# Patient Record
Sex: Male | Born: 1945 | Race: White | Hispanic: No | Marital: Married | Smoking: Never smoker
Health system: Southern US, Community
[De-identification: ages and names within clinical notes are randomized; demographics above are authoritative.]

## PROBLEM LIST (undated history)

## (undated) DIAGNOSIS — I10 Essential (primary) hypertension: Secondary | ICD-10-CM

## (undated) DIAGNOSIS — E785 Hyperlipidemia, unspecified: Secondary | ICD-10-CM

## (undated) DIAGNOSIS — F32A Depression, unspecified: Secondary | ICD-10-CM

## (undated) DIAGNOSIS — F329 Major depressive disorder, single episode, unspecified: Secondary | ICD-10-CM

---

## 2009-07-01 ENCOUNTER — Emergency Department (HOSPITAL_BASED_OUTPATIENT_CLINIC_OR_DEPARTMENT_OTHER): Admission: EM | Admit: 2009-07-01 | Discharge: 2009-07-01 | Payer: Self-pay | Admitting: Emergency Medicine

## 2011-01-21 ENCOUNTER — Emergency Department (HOSPITAL_BASED_OUTPATIENT_CLINIC_OR_DEPARTMENT_OTHER)
Admission: EM | Admit: 2011-01-21 | Discharge: 2011-01-21 | Disposition: A | Discharge status: Nursing Home (Includes ICF) | Payer: Managed Care, Other (non HMO) | Source: Home / Self Care | Attending: Emergency Medicine | Admitting: Emergency Medicine

## 2011-01-21 ENCOUNTER — Observation Stay (HOSPITAL_COMMUNITY)
Admission: EM | Admit: 2011-01-21 | Discharge: 2011-01-22 | Disposition: A | Discharge status: Home / Self Care | Payer: Managed Care, Other (non HMO) | Attending: Emergency Medicine | Admitting: Emergency Medicine

## 2011-01-21 ENCOUNTER — Emergency Department (HOSPITAL_COMMUNITY): Payer: Managed Care, Other (non HMO)

## 2011-01-21 ENCOUNTER — Emergency Department (INDEPENDENT_AMBULATORY_CARE_PROVIDER_SITE_OTHER): Payer: Managed Care, Other (non HMO)

## 2011-01-21 ENCOUNTER — Inpatient Hospital Stay (HOSPITAL_COMMUNITY): Admission: EM | Admit: 2011-01-21 | Payer: Self-pay | Source: Ambulatory Visit | Admitting: Orthopedic Surgery

## 2011-01-21 DIAGNOSIS — IMO0002 Reserved for concepts with insufficient information to code with codable children: Secondary | ICD-10-CM

## 2011-01-21 DIAGNOSIS — Z01812 Encounter for preprocedural laboratory examination: Secondary | ICD-10-CM | POA: Insufficient documentation

## 2011-01-21 DIAGNOSIS — I10 Essential (primary) hypertension: Secondary | ICD-10-CM | POA: Insufficient documentation

## 2011-01-21 DIAGNOSIS — Z1810 Retained metal fragments, unspecified: Secondary | ICD-10-CM

## 2011-01-21 DIAGNOSIS — S62339B Displaced fracture of neck of unspecified metacarpal bone, initial encounter for open fracture: Secondary | ICD-10-CM | POA: Insufficient documentation

## 2011-01-21 DIAGNOSIS — S61209A Unspecified open wound of unspecified finger without damage to nail, initial encounter: Secondary | ICD-10-CM | POA: Insufficient documentation

## 2011-01-21 DIAGNOSIS — Z79899 Other long term (current) drug therapy: Secondary | ICD-10-CM | POA: Insufficient documentation

## 2011-01-21 DIAGNOSIS — S61409A Unspecified open wound of unspecified hand, initial encounter: Secondary | ICD-10-CM | POA: Insufficient documentation

## 2011-01-21 DIAGNOSIS — S6290XB Unspecified fracture of unspecified wrist and hand, initial encounter for open fracture: Principal | ICD-10-CM | POA: Insufficient documentation

## 2011-01-21 DIAGNOSIS — W320XXA Accidental handgun discharge, initial encounter: Secondary | ICD-10-CM | POA: Insufficient documentation

## 2011-01-21 DIAGNOSIS — E785 Hyperlipidemia, unspecified: Secondary | ICD-10-CM | POA: Insufficient documentation

## 2011-01-21 LAB — CBC
HCT: 42.7 % (ref 39.0–52.0)
MCH: 31.4 pg (ref 26.0–34.0)
MCV: 95.7 fL (ref 78.0–100.0)
Platelets: 111 10*3/uL — ABNORMAL LOW (ref 150–400)
RBC: 4.46 MIL/uL (ref 4.22–5.81)
RDW: 13.5 % (ref 11.5–15.5)
WBC: 12.5 10*3/uL — ABNORMAL HIGH (ref 4.0–10.5)

## 2011-01-21 LAB — BASIC METABOLIC PANEL
BUN: 13 mg/dL (ref 6–23)
Chloride: 103 mEq/L (ref 96–112)
Creatinine, Ser: 1.14 mg/dL (ref 0.4–1.5)
Glucose, Bld: 141 mg/dL — ABNORMAL HIGH (ref 70–99)
Potassium: 3.9 mEq/L (ref 3.5–5.1)

## 2011-01-21 LAB — DIFFERENTIAL
Eosinophils Absolute: 0.1 10*3/uL (ref 0.0–0.7)
Eosinophils Relative: 1 % (ref 0–5)
Lymphocytes Relative: 15 % (ref 12–46)
Lymphs Abs: 1.9 10*3/uL (ref 0.7–4.0)
Monocytes Relative: 9 % (ref 3–12)
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
## 2014-10-10 ENCOUNTER — Emergency Department (HOSPITAL_BASED_OUTPATIENT_CLINIC_OR_DEPARTMENT_OTHER)
Admission: EM | Admit: 2014-10-10 | Discharge: 2014-10-11 | Disposition: A | Discharge status: Other Acute Inpatient Hospital | Payer: Medicare HMO | Attending: Emergency Medicine | Admitting: Emergency Medicine

## 2014-10-10 ENCOUNTER — Encounter (HOSPITAL_BASED_OUTPATIENT_CLINIC_OR_DEPARTMENT_OTHER): Payer: Self-pay | Admitting: *Deleted

## 2014-10-10 ENCOUNTER — Emergency Department (HOSPITAL_BASED_OUTPATIENT_CLINIC_OR_DEPARTMENT_OTHER): Payer: Medicare HMO

## 2014-10-10 DIAGNOSIS — I10 Essential (primary) hypertension: Secondary | ICD-10-CM | POA: Insufficient documentation

## 2014-10-10 DIAGNOSIS — Z79899 Other long term (current) drug therapy: Secondary | ICD-10-CM | POA: Diagnosis not present

## 2014-10-10 DIAGNOSIS — E785 Hyperlipidemia, unspecified: Secondary | ICD-10-CM | POA: Diagnosis not present

## 2014-10-10 DIAGNOSIS — Y929 Unspecified place or not applicable: Secondary | ICD-10-CM | POA: Diagnosis not present

## 2014-10-10 DIAGNOSIS — S42202A Unspecified fracture of upper end of left humerus, initial encounter for closed fracture: Secondary | ICD-10-CM

## 2014-10-10 DIAGNOSIS — S79912A Unspecified injury of left hip, initial encounter: Secondary | ICD-10-CM | POA: Diagnosis not present

## 2014-10-10 DIAGNOSIS — F329 Major depressive disorder, single episode, unspecified: Secondary | ICD-10-CM | POA: Insufficient documentation

## 2014-10-10 DIAGNOSIS — Y9389 Activity, other specified: Secondary | ICD-10-CM | POA: Insufficient documentation

## 2014-10-10 DIAGNOSIS — W1839XA Other fall on same level, initial encounter: Secondary | ICD-10-CM | POA: Diagnosis not present

## 2014-10-10 DIAGNOSIS — W19XXXA Unspecified fall, initial encounter: Secondary | ICD-10-CM

## 2014-10-10 DIAGNOSIS — S4990XA Unspecified injury of shoulder and upper arm, unspecified arm, initial encounter: Secondary | ICD-10-CM | POA: Diagnosis present

## 2014-10-10 DIAGNOSIS — S42212A Unspecified displaced fracture of surgical neck of left humerus, initial encounter for closed fracture: Secondary | ICD-10-CM | POA: Insufficient documentation

## 2014-10-10 DIAGNOSIS — Y998 Other external cause status: Secondary | ICD-10-CM | POA: Diagnosis not present

## 2014-10-10 DIAGNOSIS — F10929 Alcohol use, unspecified with intoxication, unspecified: Secondary | ICD-10-CM

## 2014-10-10 DIAGNOSIS — F10120 Alcohol abuse with intoxication, uncomplicated: Secondary | ICD-10-CM | POA: Insufficient documentation

## 2014-10-10 HISTORY — DX: Major depressive disorder, single episode, unspecified: F32.9

## 2014-10-10 HISTORY — DX: Morbid (severe) obesity due to excess calories: E66.01

## 2014-10-10 HISTORY — DX: Essential (primary) hypertension: I10

## 2014-10-10 HISTORY — DX: Hyperlipidemia, unspecified: E78.5

## 2014-10-10 HISTORY — DX: Depression, unspecified: F32.A

## 2014-10-10 LAB — COMPREHENSIVE METABOLIC PANEL
ALK PHOS: 70 U/L (ref 39–117)
ALT: 18 U/L (ref 0–53)
AST: 32 U/L (ref 0–37)
Albumin: 3.4 g/dL — ABNORMAL LOW (ref 3.5–5.2)
Anion gap: 13 (ref 5–15)
BUN: 14 mg/dL (ref 6–23)
CHLORIDE: 101 meq/L (ref 96–112)
CO2: 24 meq/L (ref 19–32)
Calcium: 9.8 mg/dL (ref 8.4–10.5)
Creatinine, Ser: 1.4 mg/dL — ABNORMAL HIGH (ref 0.50–1.35)
GFR, EST AFRICAN AMERICAN: 58 mL/min — AB (ref >90)
GFR, EST NON AFRICAN AMERICAN: 50 mL/min — AB (ref >90)
GLUCOSE: 97 mg/dL (ref 70–99)
POTASSIUM: 3.8 meq/L (ref 3.7–5.3)
SODIUM: 138 meq/L (ref 137–147)
Total Bilirubin: 1.5 mg/dL — ABNORMAL HIGH (ref 0.3–1.2)
Total Protein: 7.4 g/dL (ref 6.0–8.3)

## 2014-10-10 LAB — CBC WITH DIFFERENTIAL/PLATELET
Basophils Absolute: 0.1 10*3/uL (ref 0.0–0.1)
Basophils Relative: 1 % (ref 0–1)
Eosinophils Absolute: 0.4 10*3/uL (ref 0.0–0.7)
Eosinophils Relative: 3 % (ref 0–5)
HCT: 37 % — ABNORMAL LOW (ref 39.0–52.0)
Hemoglobin: 12.1 g/dL — ABNORMAL LOW (ref 13.0–17.0)
LYMPHS ABS: 2.6 10*3/uL (ref 0.7–4.0)
LYMPHS PCT: 24 % (ref 12–46)
MCH: 32.9 pg (ref 26.0–34.0)
MCHC: 32.7 g/dL (ref 30.0–36.0)
MCV: 100.5 fL — AB (ref 78.0–100.0)
Monocytes Absolute: 1.3 10*3/uL — ABNORMAL HIGH (ref 0.1–1.0)
Monocytes Relative: 11 % (ref 3–12)
NEUTROS ABS: 6.7 10*3/uL (ref 1.7–7.7)
NEUTROS PCT: 61 % (ref 43–77)
PLATELETS: 68 10*3/uL — AB (ref 150–400)
RBC: 3.68 MIL/uL — AB (ref 4.22–5.81)
RDW: 15.1 % (ref 11.5–15.5)
WBC: 11 10*3/uL — AB (ref 4.0–10.5)

## 2014-10-10 LAB — ETHANOL: Alcohol, Ethyl (B): 181 mg/dL — ABNORMAL HIGH (ref 0–11)

## 2014-10-10 LAB — PROTIME-INR
INR: 1.37 (ref 0.00–1.49)
Prothrombin Time: 16.9 seconds — ABNORMAL HIGH (ref 11.6–15.2)

## 2014-10-10 MED ORDER — OXYCODONE-ACETAMINOPHEN 7.5-325 MG PO TABS: 1.0000 | ORAL_TABLET | ORAL | Status: AC | PRN

## 2014-10-10 MED ORDER — SODIUM CHLORIDE 0.9 % IV BOLUS (SEPSIS)
1000.0000 mL | Freq: Once | INTRAVENOUS | Status: AC
  Administered 2014-10-10: 1000 mL via INTRAVENOUS

## 2014-10-10 MED ORDER — ONDANSETRON HCL 4 MG/2ML IJ SOLN
4.0000 mg | Freq: Once | INTRAMUSCULAR | Status: AC
  Administered 2014-10-10: 4 mg via INTRAVENOUS
  Filled 2014-10-10: qty 2

## 2014-10-10 MED ORDER — MORPHINE SULFATE 4 MG/ML IJ SOLN
4.0000 mg | Freq: Once | INTRAMUSCULAR | Status: AC
  Administered 2014-10-10: 4 mg via INTRAVENOUS
  Filled 2014-10-10: qty 1

## 2014-10-10 NOTE — Discharge Instructions (Signed)
Humerus Fracture, Treated with Immobilization °The humerus is the large bone in the upper arm. A broken (fractured) humerus is often treated by wearing a cast, splint, or sling (immobilization). This holds the broken pieces in place so they can heal.  °HOME CARE °· Put ice on the injured area. °¨ Put ice in a plastic bag. °¨ Place a towel between your skin and the bag. °¨ Leave the ice on for 15-20 minutes, 03-04 times a day. °· If you are given a cast: °¨ Do not scratch the skin under the cast. °¨ Check the skin around the cast every day. You may put lotion on any red or sore areas. °¨ Keep the cast dry and clean. °· If you are given a splint: °¨ Wear the splint as told. °¨ Keep the splint clean and dry. °¨ Loosen the elastic around the splint if your fingers become numb, cold, tingle, or turn blue. °· If you are given a sling: °¨ Wear the sling as told. °· Do not put pressure on any part of the cast or splint until it is fully hardened. °· The cast or splint must be protected with a plastic bag during bathing. Do not lower the cast or splint into water. °· Only take medicine as told by your doctor. °· Do exercises as told by your doctor. °· Follow up as told by your doctor. °GET HELP RIGHT AWAY IF:  °· Your skin or fingernails turn blue or gray. °· Your arm feels cold or numb. °· You have very bad pain in the injured arm. °· You are having problems with the medicines you were given. °MAKE SURE YOU:  °· Understand these instructions. °· Will watch your condition. °· Will get help right away if you are not doing well or get worse. °Document Released: 05/03/2008 Document Revised: 02/07/2012 Document Reviewed: 12/30/2010 °[employer]® Patient Information ©2015 [employer]. This information is not intended to replace advice given to you by your health care provider. Make sure you discuss any questions you have with your health care provider. ° °

## 2014-10-10 NOTE — ED Notes (Signed)
MD at bedside. 

## 2014-10-10 NOTE — ED Notes (Signed)
Patient transported to X-ray 

## 2014-10-10 NOTE — ED Notes (Addendum)
Pt c/o fall with left shoulder and left hip pain x 10 mins ago pt reports feeling faint bp 80/40

## 2014-10-10 NOTE — ED Notes (Signed)
MD at bedside discussing plan of care.  Pt will be admitted since his bp is not holding sufficiently to go home.

## 2014-10-10 NOTE — ED Notes (Signed)
Pt c/o increased pain.  MD notified.  Order received.
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
## 2014-10-10 NOTE — ED Notes (Signed)
MD at bedside discussing test results and dispo plan of care. 

## 2014-10-10 NOTE — ED Notes (Signed)
Preparing for pt DC.  BP noted to have dropped again.  MD notified.  Fluids started.  Plan of care changed.  Pt will not be DC'd to home.  Calling hospitalist for admission.

## 2014-10-11 ENCOUNTER — Encounter (HOSPITAL_BASED_OUTPATIENT_CLINIC_OR_DEPARTMENT_OTHER): Payer: Self-pay | Admitting: Emergency Medicine

## 2014-10-11 DIAGNOSIS — Y9389 Activity, other specified: Secondary | ICD-10-CM | POA: Diagnosis not present

## 2014-10-11 DIAGNOSIS — S79912A Unspecified injury of left hip, initial encounter: Secondary | ICD-10-CM | POA: Diagnosis not present

## 2014-10-11 DIAGNOSIS — Y929 Unspecified place or not applicable: Secondary | ICD-10-CM | POA: Diagnosis not present

## 2014-10-11 DIAGNOSIS — I10 Essential (primary) hypertension: Secondary | ICD-10-CM | POA: Diagnosis not present

## 2014-10-11 DIAGNOSIS — F10120 Alcohol abuse with intoxication, uncomplicated: Secondary | ICD-10-CM | POA: Diagnosis not present

## 2014-10-11 DIAGNOSIS — Y998 Other external cause status: Secondary | ICD-10-CM | POA: Diagnosis not present

## 2014-10-11 DIAGNOSIS — E785 Hyperlipidemia, unspecified: Secondary | ICD-10-CM | POA: Diagnosis not present

## 2014-10-11 DIAGNOSIS — F329 Major depressive disorder, single episode, unspecified: Secondary | ICD-10-CM | POA: Diagnosis not present

## 2014-10-11 DIAGNOSIS — Z79899 Other long term (current) drug therapy: Secondary | ICD-10-CM | POA: Diagnosis not present

## 2014-10-11 DIAGNOSIS — S4990XA Unspecified injury of shoulder and upper arm, unspecified arm, initial encounter: Secondary | ICD-10-CM | POA: Diagnosis present

## 2014-10-11 DIAGNOSIS — W1839XA Other fall on same level, initial encounter: Secondary | ICD-10-CM | POA: Diagnosis not present

## 2014-10-11 DIAGNOSIS — S42212A Unspecified displaced fracture of surgical neck of left humerus, initial encounter for closed fracture: Secondary | ICD-10-CM | POA: Diagnosis not present

## 2014-10-11 NOTE — ED Notes (Signed)
tct lisa, [facility] to determine [facility] time of bed assignment, Misty StanleyLisa stated they would go ahead and place patient in a bed and would call back with a bed assignment immediately, awaiting return call with bed assignment

## 2014-10-11 NOTE — ED Notes (Signed)
Sp[facility] with [facility] supervisor at [facility], MD Anatole Hounnou did contact her and request a bed, but at this time they have no beds available for patient, but they are working to get one. Misty StanleyLisa stated she would contact this facility when a bed becomes available

## 2016-06-23 IMAGING — CR DG HIP (WITH OR WITHOUT PELVIS) 2-3V*L*
3 series · 3 of 3 positions shown · non-contrast
Comparison: CT pelvis 04/09/2008 and 03/04/2009.

CLINICAL DATA: Status post fall. Left hip pain. Injury occurred
today. Initially catheter.

EXAM:
LEFT HIP - COMPLETE 2+ VIEW

[t pelvis a.p.]
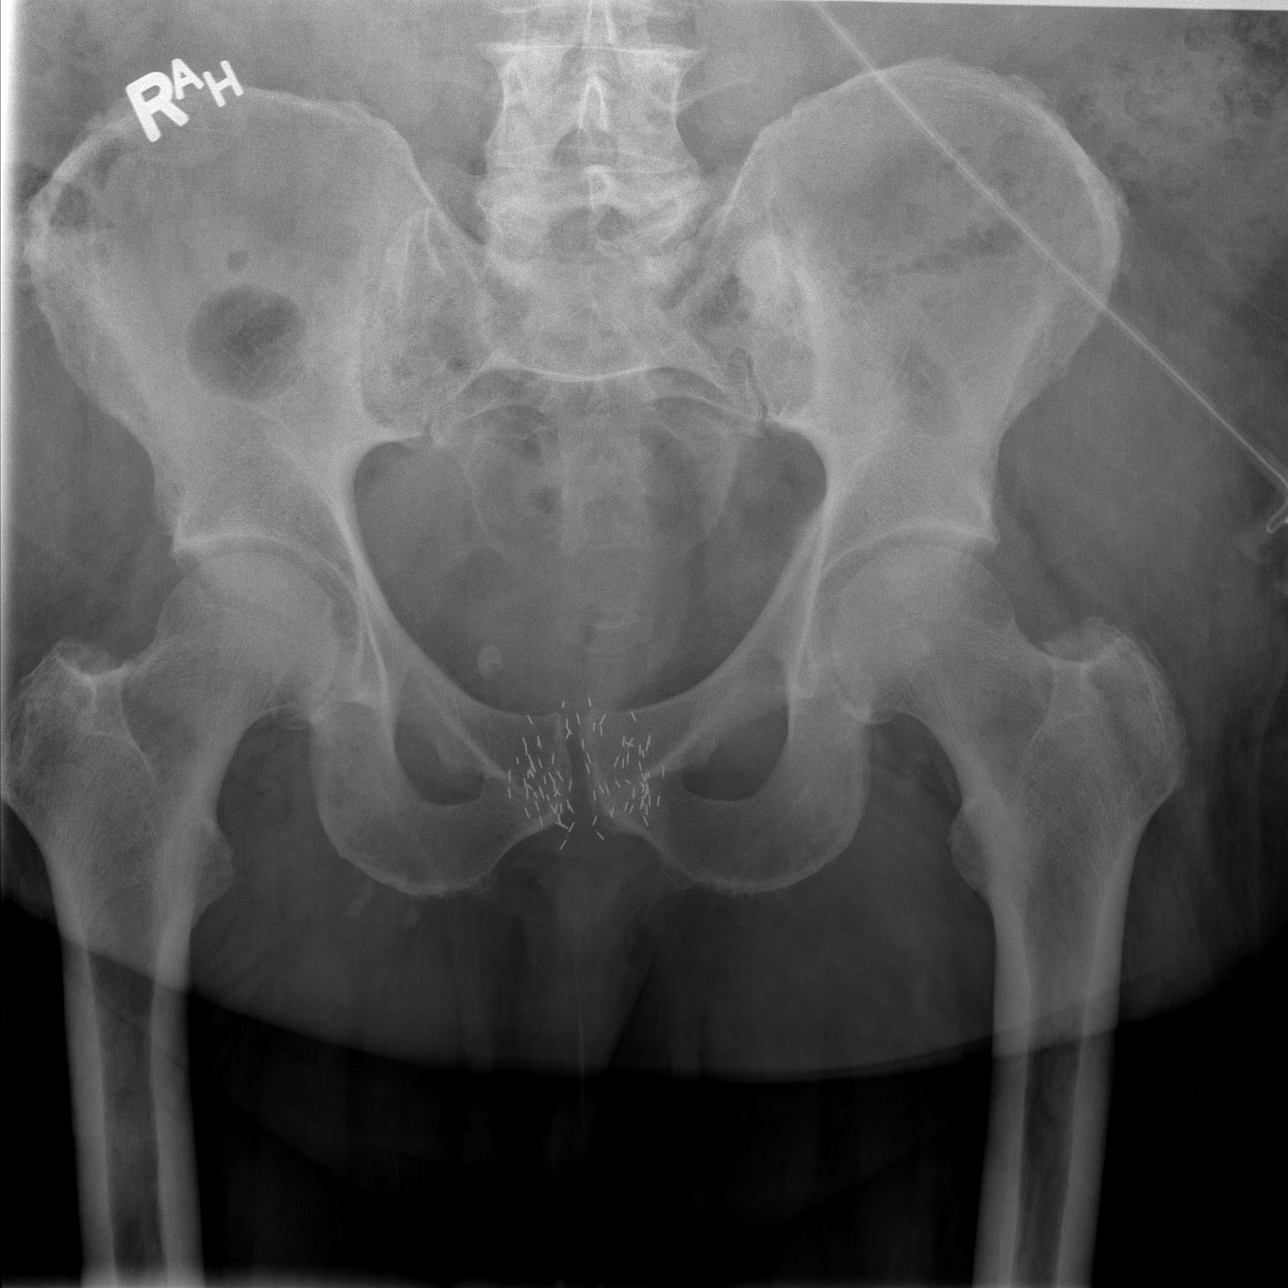

[t hip ap left]
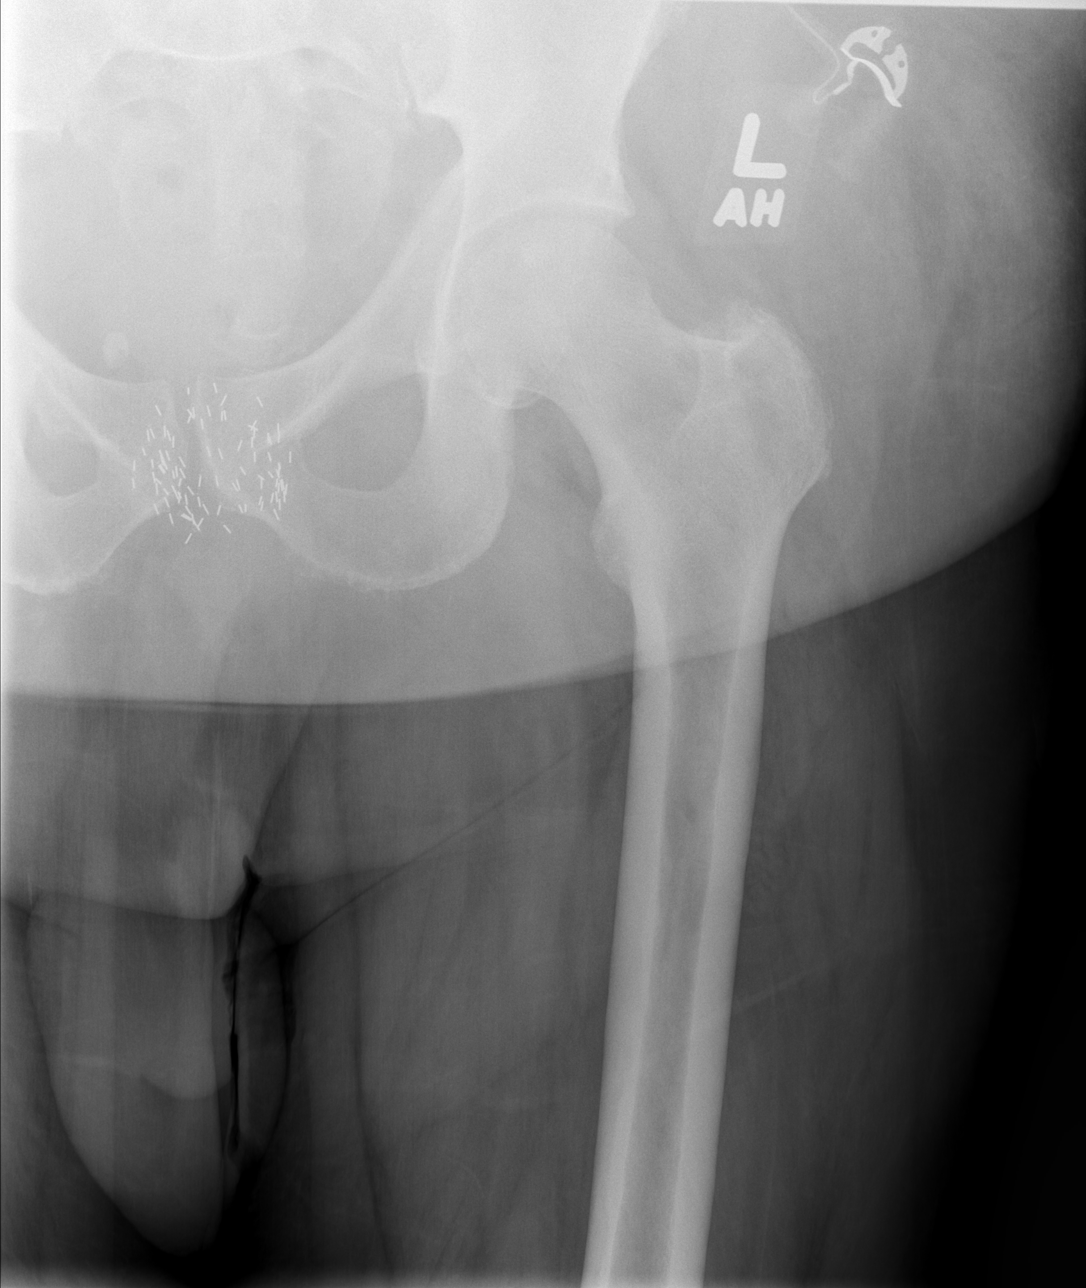

[t hip frog leg left]
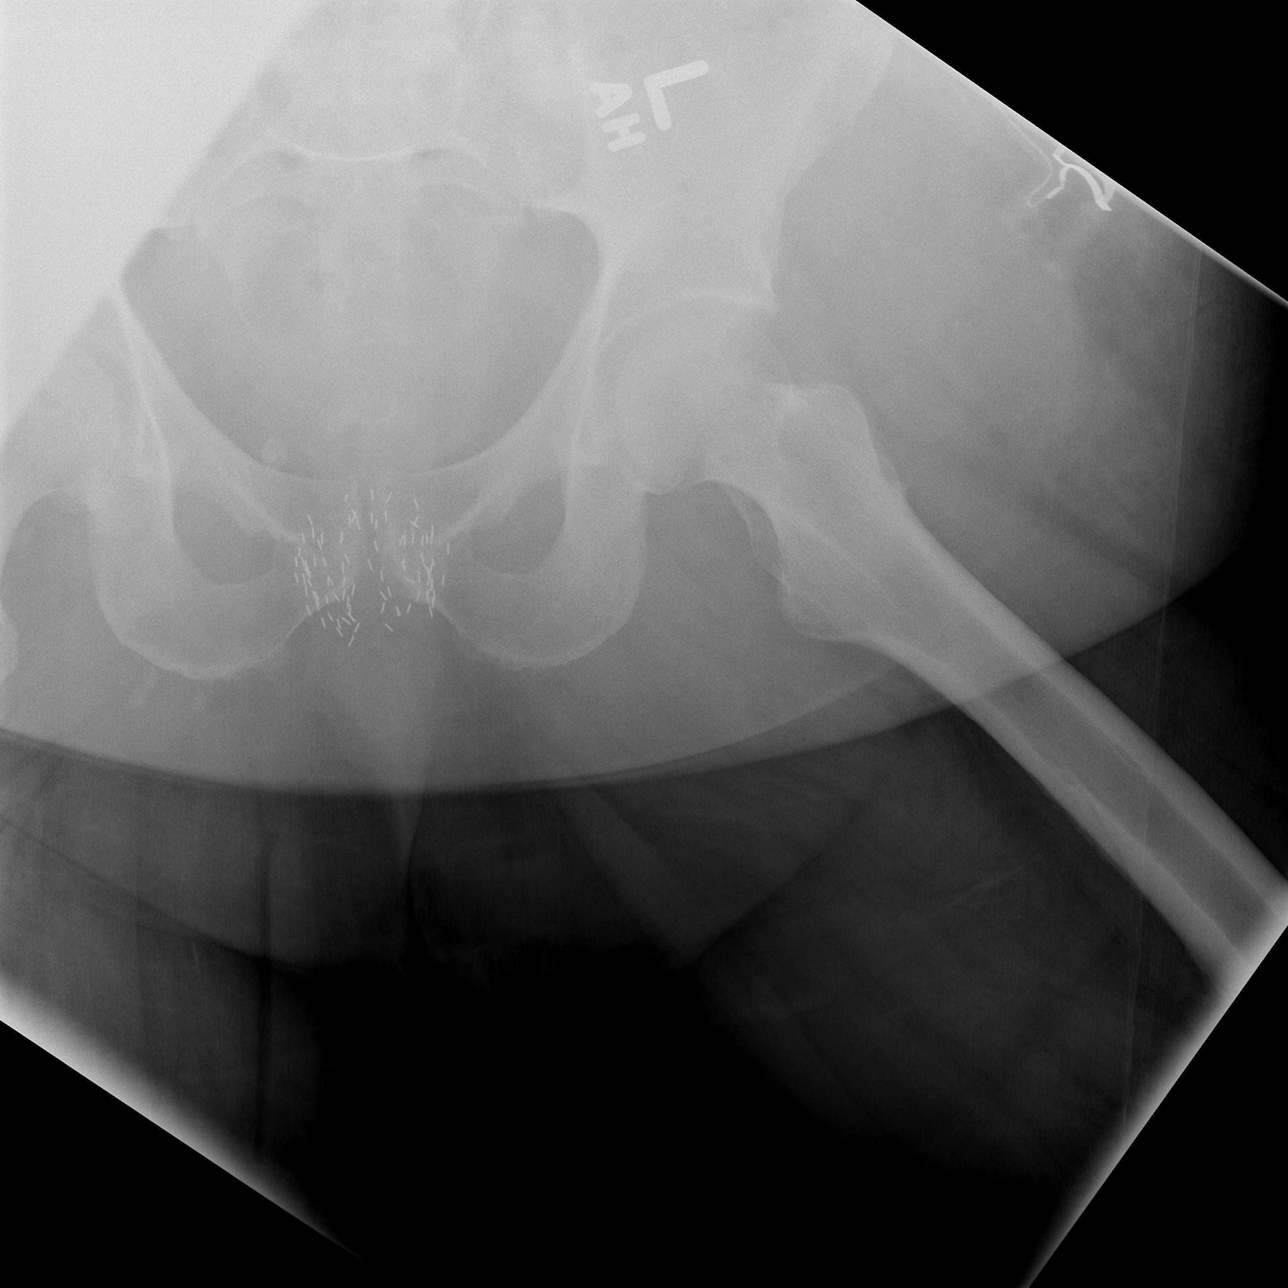

[3 of 3 positions shown; findings below may reference images not displayed]

FINDINGS: There is no acute bony or joint abnormality. No notable degenerative
change is seen. Radiotherapy seeds for prostate cancer noted.
Rounded area of sclerosis projects over the left sacrum and is not
identified on the prior exams.
IMPRESSION: No acute abnormality.

Rounded sclerotic focus left sacrum. Recommend correlation with PSA.

## 2016-11-29 DEATH — deceased
